# Patient Record
Sex: Male | Born: 1975 | Race: White | Hispanic: No | Marital: Single | Smoking: Current every day smoker
Health system: Southern US, Community
[De-identification: ages and names within clinical notes are randomized; demographics above are authoritative.]

## PROBLEM LIST (undated history)

## (undated) DIAGNOSIS — F1910 Other psychoactive substance abuse, uncomplicated: Secondary | ICD-10-CM

## (undated) DIAGNOSIS — J45909 Unspecified asthma, uncomplicated: Secondary | ICD-10-CM

## (undated) DIAGNOSIS — I10 Essential (primary) hypertension: Secondary | ICD-10-CM

---

## 2002-09-17 ENCOUNTER — Emergency Department (HOSPITAL_COMMUNITY): Admission: EM | Admit: 2002-09-17 | Discharge: 2002-09-17 | Payer: Self-pay | Admitting: Emergency Medicine

## 2003-04-23 ENCOUNTER — Emergency Department (HOSPITAL_COMMUNITY): Admission: EM | Admit: 2003-04-23 | Discharge: 2003-04-23 | Payer: Self-pay | Admitting: Emergency Medicine

## 2003-05-03 ENCOUNTER — Emergency Department (HOSPITAL_COMMUNITY): Admission: EM | Admit: 2003-05-03 | Discharge: 2003-05-03 | Payer: Self-pay | Admitting: Emergency Medicine

## 2003-05-07 ENCOUNTER — Emergency Department (HOSPITAL_COMMUNITY): Admission: EM | Admit: 2003-05-07 | Discharge: 2003-05-07 | Payer: Self-pay | Admitting: Emergency Medicine

## 2003-05-31 ENCOUNTER — Emergency Department (HOSPITAL_COMMUNITY): Admission: EM | Admit: 2003-05-31 | Discharge: 2003-05-31 | Payer: Self-pay | Admitting: Emergency Medicine

## 2003-07-02 ENCOUNTER — Emergency Department (HOSPITAL_COMMUNITY): Admission: EM | Admit: 2003-07-02 | Discharge: 2003-07-02 | Payer: Self-pay | Admitting: Emergency Medicine

## 2003-07-10 ENCOUNTER — Emergency Department (HOSPITAL_COMMUNITY): Admission: AD | Admit: 2003-07-10 | Discharge: 2003-07-10 | Payer: Self-pay | Admitting: Family Medicine

## 2003-07-12 ENCOUNTER — Emergency Department (HOSPITAL_COMMUNITY): Admission: AD | Admit: 2003-07-12 | Discharge: 2003-07-12 | Payer: Self-pay | Admitting: Family Medicine

## 2003-07-13 ENCOUNTER — Emergency Department (HOSPITAL_COMMUNITY): Admission: EM | Admit: 2003-07-13 | Discharge: 2003-07-13 | Payer: Self-pay | Admitting: Emergency Medicine

## 2003-10-19 ENCOUNTER — Emergency Department (HOSPITAL_COMMUNITY): Admission: EM | Admit: 2003-10-19 | Discharge: 2003-10-19 | Payer: Self-pay | Admitting: Family Medicine

## 2004-09-05 ENCOUNTER — Emergency Department (HOSPITAL_COMMUNITY): Admission: EM | Admit: 2004-09-05 | Discharge: 2004-09-05 | Payer: Self-pay | Admitting: Family Medicine

## 2004-10-08 ENCOUNTER — Emergency Department (HOSPITAL_COMMUNITY): Admission: EM | Admit: 2004-10-08 | Discharge: 2004-10-08 | Payer: Self-pay | Admitting: Family Medicine

## 2004-10-08 ENCOUNTER — Emergency Department: Payer: Self-pay | Admitting: Emergency Medicine

## 2004-12-01 ENCOUNTER — Emergency Department (HOSPITAL_COMMUNITY): Admission: EM | Admit: 2004-12-01 | Discharge: 2004-12-01 | Payer: Self-pay | Admitting: Family Medicine

## 2004-12-01 ENCOUNTER — Emergency Department (HOSPITAL_COMMUNITY): Admission: EM | Admit: 2004-12-01 | Discharge: 2004-12-01 | Payer: Self-pay | Admitting: Emergency Medicine

## 2005-01-31 ENCOUNTER — Emergency Department (HOSPITAL_COMMUNITY): Admission: EM | Admit: 2005-01-31 | Discharge: 2005-01-31 | Payer: Self-pay | Admitting: Emergency Medicine

## 2005-04-03 ENCOUNTER — Emergency Department (HOSPITAL_COMMUNITY): Admission: EM | Admit: 2005-04-03 | Discharge: 2005-04-03 | Payer: Self-pay | Admitting: Emergency Medicine

## 2005-06-08 ENCOUNTER — Emergency Department (HOSPITAL_COMMUNITY): Admission: EM | Admit: 2005-06-08 | Discharge: 2005-06-08 | Payer: Self-pay | Admitting: Emergency Medicine

## 2006-09-28 ENCOUNTER — Emergency Department (HOSPITAL_COMMUNITY): Admission: EM | Admit: 2006-09-28 | Discharge: 2006-09-28 | Payer: Self-pay | Admitting: Emergency Medicine
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
## 2007-01-20 ENCOUNTER — Emergency Department (HOSPITAL_COMMUNITY): Admission: EM | Admit: 2007-01-20 | Discharge: 2007-01-20 | Payer: Self-pay | Admitting: Emergency Medicine

## 2012-06-05 ENCOUNTER — Emergency Department (HOSPITAL_COMMUNITY)
Admission: EM | Admit: 2012-06-05 | Discharge: 2012-06-06 | Disposition: A | Discharge status: Home / Self Care | Payer: Self-pay | Attending: Emergency Medicine | Admitting: Emergency Medicine

## 2012-06-05 ENCOUNTER — Encounter (HOSPITAL_COMMUNITY): Payer: Self-pay | Admitting: Emergency Medicine

## 2012-06-05 DIAGNOSIS — F1120 Opioid dependence, uncomplicated: Secondary | ICD-10-CM | POA: Insufficient documentation

## 2012-06-05 DIAGNOSIS — F17200 Nicotine dependence, unspecified, uncomplicated: Secondary | ICD-10-CM | POA: Insufficient documentation

## 2012-06-05 DIAGNOSIS — R110 Nausea: Secondary | ICD-10-CM | POA: Insufficient documentation

## 2012-06-05 DIAGNOSIS — J45909 Unspecified asthma, uncomplicated: Secondary | ICD-10-CM | POA: Insufficient documentation

## 2012-06-05 DIAGNOSIS — Z79899 Other long term (current) drug therapy: Secondary | ICD-10-CM | POA: Insufficient documentation

## 2012-06-05 HISTORY — DX: Unspecified asthma, uncomplicated: J45.909

## 2012-06-05 HISTORY — DX: Other psychoactive substance abuse, uncomplicated: F19.10

## 2012-06-05 NOTE — ED Notes (Signed)
Pt c/o nausea, restlessness, lethargy. Pt states he tapered Methadone after 19yrs of usage.Last dose Monday 1mg . Pt would like guidance on detox, pt states he was not anticipating feeling the s/s he is having at this time. Pt appears weak in triage.

## 2012-06-06 MED ORDER — METHADONE HCL 10 MG PO TABS
10.0000 mg | ORAL_TABLET | Freq: Once | ORAL | Status: AC
  Administered 2012-06-06: 10 mg via ORAL
  Filled 2012-06-06: qty 1

## 2012-06-06 MED ORDER — CLONIDINE HCL 0.1 MG/24HR TD PTWK: MEDICATED_PATCH | TRANSDERMAL | Status: DC

## 2012-06-06 MED ORDER — ONDANSETRON 8 MG PO TBDP: ORAL_TABLET | ORAL | Status: DC

## 2012-06-06 MED ORDER — METOCLOPRAMIDE HCL 10 MG PO TABS: 10.0000 mg | ORAL_TABLET | Freq: Four times a day (QID) | ORAL | Status: DC | PRN

## 2012-06-06 NOTE — ED Provider Notes (Signed)
History     CSN: [id number]  Arrival date & time 06/05/12  2120   First MD Initiated Contact with Patient 06/05/12 2325      Chief Complaint  Patient presents with  . 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 There is no treatment prior to arrival. He wants to follow up with his methadone clinic and likely start back on methadone but clinics not open tonight. He wants 10mg  methadone in ED. Past Medical History  Diagnosis Date  . Substance abuse   . Asthma     History reviewed. No pertinent past surgical history.  No family history on file.  History  Substance Use Topics  . Smoking status: Current Every Day Smoker  . Smokeless tobacco: Not on file  . Alcohol Use: No      Review of Systems 10 Systems reviewed and are negative for acute change except as noted in the HPI. Allergies  Review of patient's allergies indicates no known allergies.  Home Medications   Current Outpatient Rx  Name  Route  Sig  Dispense  Refill  . ALBUTEROL SULFATE HFA 108 (90 BASE) MCG/ACT IN AERS   Inhalation   Inhale 2 puffs into the lungs every 6 (six) hours as needed. For shortness of breath         . ALPRAZOLAM 2 MG PO TABS   Oral   Take 2 mg by mouth 3 (three) times daily as needed. For anxiety         . AMPHETAMINE-DEXTROAMPHETAMINE 30 MG PO TABS   Oral   Take 30 mg by mouth 2 (two) times daily as needed. For A.D.D         . IBUPROFEN 200 MG PO TABS   Oral   Take 800 mg by mouth every 6  (six) hours as needed. For pain         . METHADONE HCL 5 MG/5ML PO SOLN   Oral   Take 1 mg by mouth daily.         Marland Kitchen CLONIDINE HCL 0.1 MG/24HR TD PTWK      Apply all 3 patches at once and leave them on for 48 hours, after the first 48 hours remove one patch, after the second 48 hours remove one patch, then leave the remaining single patch on for 2-3 days then remove.   3 patch   0   . METOCLOPRAMIDE HCL 10 MG PO TABS   Oral   Take 1 tablet (10 mg total) by mouth every 6 (six) hours as needed (nausea/headache).   6 tablet   0   . ONDANSETRON 8 MG PO TBDP      8mg  ODT q4 hours prn nausea   4 tablet   0     BP 120/82  Pulse 89  Temp 97.8 F (36.6 C) (Oral)  Resp 20  Ht 6' (1.829 m)  Wt 255 lb (115.667 kg)  BMI 34.58 kg/m2  SpO2 100%  Physical Exam  Nursing note and vitals reviewed. Constitutional:       Awake, alert, nontoxic appearance.  HENT:  Head: Atraumatic.  Eyes: Right eye exhibits no discharge. Left eye exhibits no discharge.  Neck: Neck supple.  Cardiovascular: Normal rate and regular rhythm.   No murmur heard. Pulmonary/Chest: Effort normal and breath sounds normal. No respiratory distress. He has no wheezes. He has no rales. He exhibits no tenderness.  Abdominal: Soft. There is no tenderness. There is no rebound.  Musculoskeletal: He exhibits no tenderness.       Baseline ROM, no obvious new focal weakness.  Neurological: He is alert.       Mental status and motor strength appears baseline for patient and situation.  Skin: No rash noted.  Psychiatric:       Anxious no SI/HI/hallucinations    ED Course  Procedures (including critical care time)  Labs Reviewed - No data to display No results found.   1. Methadone dependence       MDM   Pt stable in ED with no significant deterioration in condition.Patient / Family / Caregiver informed of clinical course, understand medical decision-making process, and agree with plan. I agreed to a  one-time dose of 10 mg methadone while the patient was in the emergency department. He understands the emergency department does not routinely dose of methadone or prescribe methadone.  I doubt any other EMC precluding discharge at this time including, but not necessarily limited to the following: Severe withdrawal.        Hurman Horn, MD 06/06/12 1432

## 2012-09-19 ENCOUNTER — Emergency Department (HOSPITAL_COMMUNITY)
Admission: EM | Admit: 2012-09-19 | Discharge: 2012-09-19 | Disposition: A | Discharge status: Home / Self Care | Payer: Self-pay | Attending: Emergency Medicine | Admitting: Emergency Medicine

## 2012-09-19 ENCOUNTER — Encounter (HOSPITAL_COMMUNITY): Payer: Self-pay

## 2012-09-19 DIAGNOSIS — J45909 Unspecified asthma, uncomplicated: Secondary | ICD-10-CM | POA: Insufficient documentation

## 2012-09-19 DIAGNOSIS — F1910 Other psychoactive substance abuse, uncomplicated: Secondary | ICD-10-CM | POA: Insufficient documentation

## 2012-09-19 DIAGNOSIS — R443 Hallucinations, unspecified: Secondary | ICD-10-CM | POA: Insufficient documentation

## 2012-09-19 DIAGNOSIS — R110 Nausea: Secondary | ICD-10-CM | POA: Insufficient documentation

## 2012-09-19 DIAGNOSIS — F17200 Nicotine dependence, unspecified, uncomplicated: Secondary | ICD-10-CM | POA: Insufficient documentation

## 2012-09-19 DIAGNOSIS — F1123 Opioid dependence with withdrawal: Secondary | ICD-10-CM

## 2012-09-19 DIAGNOSIS — F19939 Other psychoactive substance use, unspecified with withdrawal, unspecified: Secondary | ICD-10-CM | POA: Insufficient documentation

## 2012-09-19 DIAGNOSIS — Z79899 Other long term (current) drug therapy: Secondary | ICD-10-CM | POA: Insufficient documentation

## 2012-09-19 MED ORDER — PROMETHAZINE HCL 25 MG PO TABS: 25.0000 mg | ORAL_TABLET | Freq: Four times a day (QID) | ORAL | Status: DC | PRN

## 2012-09-19 MED ORDER — CLONIDINE HCL 0.2 MG PO TABS: 0.1000 mg | ORAL_TABLET | Freq: Three times a day (TID) | ORAL | Status: DC | PRN

## 2012-09-19 NOTE — ED Notes (Signed)
I have just had a lengthy conversation with pt. In the presence of our C.N., Patty, Dowd to wit: Pt. Was very unsatisfied with Dr. Patria Mane' demeanor and treatment; and was disgusted that Dr. Patria Mane did not and would not prescribe Methadone for him. It is Dr. Patria Mane' contention that pt. Is very nearly through his Methadone withdrawal symptoms, and the appropriate treatment for this at this time is clonidine and phenergan, with which pt. Very much disagrees.  He further refuses to accept prescriptions of discharge instructions, stating, "I'm gonna call Monday and tell them I am not going to pay for any of this visit; and Dr. Patria Mane was very unprofessional".  He did acknowlege the nursing staff were "nice".

## 2012-09-19 NOTE — ED Notes (Signed)
He states he has been financially unable to continue his Methadone clinic treatments as of the 20th of this month.  He had gotten down to a daily dose of 25 mg of methadone at the clinic since Dec. Of last year.  He c/o anxiety/diarrhea/occasional vomiting, chills and "jumpiness".  He is oriented x 4 and in no distress.  His skin is flushed, warm and dry.  He is concerned about the cost of any prescriptions written, as he has been unable to afford some of those he obtained here in the past.

## 2012-09-19 NOTE — ED Provider Notes (Signed)
History     CSN: [id number]  Arrival date & time 09/19/12  0911   First MD Initiated Contact with Patient 09/19/12 0913      Chief Complaint  Patient presents with  . Withdrawal     The history is provided by the patient and medical records.   Patient reports long-standing history of methadone use.  His last use of methadone was 5 days ago.  He no longer is able to afford his methadone the methadone clinic and is requesting assistance with withdrawal symptoms.  He would like for me to prescribe him methadone.  Patient has some nausea and decreased sleep.  He states he tried Ambien before but makes him hallucinate.  He states he can't afford the clonidine patch that's been prescribed before in the past.  Patient symptoms are mild to moderate.   Past Medical History  Diagnosis Date  . Substance abuse   . Asthma     History reviewed. No pertinent past surgical history.  History reviewed. No pertinent family history.  History  Substance Use Topics  . Smoking status: Current Every Day Smoker  . Smokeless tobacco: Not on file  . Alcohol Use: No      Review of Systems  All other systems reviewed and are negative.    Allergies  Review of patient's allergies indicates no known allergies.  Home Medications   Current Outpatient Rx  Name  Route  Sig  Dispense  Refill  . albuterol (PROVENTIL HFA;VENTOLIN HFA) 108 (90 BASE) MCG/ACT inhaler   Inhalation   Inhale 2 puffs into the lungs every 6 (six) hours as needed. For shortness of breath         . alprazolam (XANAX) 2 MG tablet   Oral   Take 2 mg by mouth 3 (three) times daily as needed. For anxiety         . amphetamine-dextroamphetamine (ADDERALL) 30 MG tablet   Oral   Take 30 mg by mouth 2 (two) times daily as needed. For A.D.D         . cloNIDine (CATAPRES - DOSED IN MG/24 HR) 0.1 mg/24hr patch      Apply all 3 patches at once and leave them on for 48 hours, after the first 48 hours remove one patch, after  the second 48 hours remove one patch, then leave the remaining single patch on for 2-3 days then remove.   3 patch   0   . ibuprofen (ADVIL,MOTRIN) 200 MG tablet   Oral   Take 800 mg by mouth every 6 (six) hours as needed. For pain         . methadone (DOLOPHINE) 5 MG/5ML solution   Oral   Take 1 mg by mouth daily.         . metoCLOPramide (REGLAN) 10 MG tablet   Oral   Take 1 tablet (10 mg total) by mouth every 6 (six) hours as needed (nausea/headache).   6 tablet   0   . ondansetron (ZOFRAN ODT) 8 MG disintegrating tablet      8mg  ODT q4 hours prn nausea   4 tablet   0     BP 142/93  Pulse 112  Temp(Src) 97.8 F (36.6 C) (Oral)  SpO2 100%  Physical Exam  Nursing note and vitals reviewed. Constitutional: He is oriented to person, place, and time. He appears well-developed and well-nourished.  HENT:  Head: Normocephalic and atraumatic.  Eyes: EOM are normal.  Neck: Normal range of motion.  Cardiovascular:  Tachycardic  Pulmonary/Chest: Effort normal.  Abdominal: He exhibits no distension.  Musculoskeletal: Normal range of motion.  Neurological: He is alert and oriented to person, place, and time.  Psychiatric: He has a normal mood and affect. Judgment normal.    ED Course  Procedures (including critical care time)  Labs Reviewed - No data to display No results found.   1. Opioid withdrawal       MDM  The patient presents today with methadone dependence.  There is no indication for involuntary commitment for inpatient treatment.  I think the patient is best managed as an outpatient for his/her dependence.  The patient will be discharged home with a prescription for clonidine and antiemitics.          Lyanne Co, MD 09/19/12 [phone]

## 2012-10-27 ENCOUNTER — Encounter (HOSPITAL_COMMUNITY): Payer: Self-pay | Admitting: Emergency Medicine

## 2012-10-27 ENCOUNTER — Emergency Department (INDEPENDENT_AMBULATORY_CARE_PROVIDER_SITE_OTHER)
Admission: EM | Admit: 2012-10-27 | Discharge: 2012-10-27 | Disposition: A | Discharge status: Home / Self Care | Payer: Self-pay | Source: Home / Self Care | Attending: Family Medicine | Admitting: Family Medicine

## 2012-10-27 DIAGNOSIS — F411 Generalized anxiety disorder: Secondary | ICD-10-CM

## 2012-10-27 DIAGNOSIS — F419 Anxiety disorder, unspecified: Secondary | ICD-10-CM

## 2012-10-27 MED ORDER — CLONAZEPAM 1 MG PO TABS: 1.0000 mg | ORAL_TABLET | Freq: Two times a day (BID) | ORAL | Status: DC | PRN

## 2012-10-27 NOTE — ED Notes (Signed)
Patient needing a medication refill-klonopin-dr millsapps was the last physician to write for klonopin.  Mark Frye requiring a visit-but patient does not have the funds for a visit.  Patient also reports "coming off" of methadone this month after a six month history of trying to reduce medication

## 2012-10-27 NOTE — ED Provider Notes (Signed)
History     CSN: [id number]  Arrival date & time 10/27/12  1439   First MD Initiated Contact with Patient 10/27/12 1443      Chief Complaint  Patient presents with  . Medication Refill    (Consider location/radiation/quality/duration/timing/severity/associated sxs/prior treatment) Patient is a 37 y.o. male presenting with mental health disorder. The history is provided by the patient.  Mental Health Problem Degree of incapacity (severity):  Mild Progression:  Unchanged Context: drug abuse   Context comment:  Off of methadone care.has primary md to further care for him. Associated symptoms: anxiety     Past Medical History  Diagnosis Date  . Substance abuse   . Asthma     History reviewed. No pertinent past surgical history.  No family history on file.  History  Substance Use Topics  . Smoking status: Current Every Day Smoker  . Smokeless tobacco: Not on file  . Alcohol Use: No      Review of Systems  Constitutional: Negative.   Psychiatric/Behavioral: The patient is nervous/anxious.     Allergies  Review of patient's allergies indicates no known allergies.  Home Medications   Current Outpatient Rx  Name  Route  Sig  Dispense  Refill  . clonazePAM (KLONOPIN) 1 MG tablet   Oral   Take 1 mg by mouth 2 (two) times daily as needed for anxiety.         . methadone (DOLOPHINE) 5 MG/5ML solution   Oral   Take 1 mg by mouth daily.         Marland Kitchen albuterol (PROVENTIL HFA;VENTOLIN HFA) 108 (90 BASE) MCG/ACT inhaler   Inhalation   Inhale 2 puffs into the lungs every 6 (six) hours as needed. For shortness of breath         . alprazolam (XANAX) 2 MG tablet   Oral   Take 2 mg by mouth 3 (three) times daily as needed. For anxiety         . amphetamine-dextroamphetamine (ADDERALL) 30 MG tablet   Oral   Take 30 mg by mouth 2 (two) times daily as needed. For A.D.D         . clonazePAM (KLONOPIN) 1 MG tablet   Oral   Take 1 tablet (1 mg total) by mouth 2  (two) times daily as needed for anxiety.   14 tablet   0   . cloNIDine (CATAPRES - DOSED IN MG/24 HR) 0.1 mg/24hr patch      Apply all 3 patches at once and leave them on for 48 hours, after the first 48 hours remove one patch, after the second 48 hours remove one patch, then leave the remaining single patch on for 2-3 days then remove.   3 patch   0   . cloNIDine (CATAPRES) 0.2 MG tablet   Oral   Take 0.5 tablets (0.1 mg total) by mouth every 8 (eight) hours as needed (withdrawl symptoms).   15 tablet   0   . ibuprofen (ADVIL,MOTRIN) 200 MG tablet   Oral   Take 800 mg by mouth every 6 (six) hours as needed. For pain         . metoCLOPramide (REGLAN) 10 MG tablet   Oral   Take 1 tablet (10 mg total) by mouth every 6 (six) hours as needed (nausea/headache).   6 tablet   0   . ondansetron (ZOFRAN ODT) 8 MG disintegrating tablet      8mg  ODT q4 hours prn nausea   4  tablet   0   . promethazine (PHENERGAN) 25 MG tablet   Oral   Take 1 tablet (25 mg total) by mouth every 6 (six) hours as needed for nausea.   15 tablet   0     BP 155/86  Pulse 121  Temp(Src) 98.1 F (36.7 C) (Oral)  Resp 20  SpO2 99%  Physical Exam  Nursing note and vitals reviewed. Constitutional: He is oriented to person, place, and time. He appears well-developed and well-nourished.  Neurological: He is alert and oriented to person, place, and time.  Skin: Skin is warm and dry.  Psychiatric: His behavior is normal. Judgment and thought content normal.    ED Course  Procedures (including critical care time)  Labs Reviewed - No data to display No results found.   1. Anxiety disorder       MDM          Linna Hoff, MD 10/27/12 1515

## 2012-10-27 NOTE — ED Notes (Signed)
Provided ice pack

## 2012-11-01 ENCOUNTER — Emergency Department (HOSPITAL_COMMUNITY)
Admission: EM | Admit: 2012-11-01 | Discharge: 2012-11-01 | Discharge status: Against Medical Advice | Payer: Self-pay | Attending: Emergency Medicine | Admitting: Emergency Medicine

## 2012-11-01 ENCOUNTER — Encounter (HOSPITAL_COMMUNITY): Payer: Self-pay | Admitting: Emergency Medicine

## 2012-11-01 ENCOUNTER — Emergency Department (HOSPITAL_COMMUNITY)
Admission: EM | Admit: 2012-11-01 | Discharge: 2012-11-01 | Disposition: A | Discharge status: Home / Self Care | Payer: Self-pay | Attending: Emergency Medicine | Admitting: Emergency Medicine

## 2012-11-01 ENCOUNTER — Encounter (HOSPITAL_COMMUNITY): Payer: Self-pay

## 2012-11-01 DIAGNOSIS — F17200 Nicotine dependence, unspecified, uncomplicated: Secondary | ICD-10-CM | POA: Insufficient documentation

## 2012-11-01 DIAGNOSIS — F1110 Opioid abuse, uncomplicated: Secondary | ICD-10-CM | POA: Insufficient documentation

## 2012-11-01 DIAGNOSIS — F19939 Other psychoactive substance use, unspecified with withdrawal, unspecified: Secondary | ICD-10-CM | POA: Insufficient documentation

## 2012-11-01 DIAGNOSIS — J45909 Unspecified asthma, uncomplicated: Secondary | ICD-10-CM | POA: Insufficient documentation

## 2012-11-01 DIAGNOSIS — Z79899 Other long term (current) drug therapy: Secondary | ICD-10-CM | POA: Insufficient documentation

## 2012-11-01 DIAGNOSIS — F1920 Other psychoactive substance dependence, uncomplicated: Secondary | ICD-10-CM | POA: Insufficient documentation

## 2012-11-01 DIAGNOSIS — F1123 Opioid dependence with withdrawal: Secondary | ICD-10-CM

## 2012-11-01 DIAGNOSIS — F1120 Opioid dependence, uncomplicated: Secondary | ICD-10-CM

## 2012-11-01 DIAGNOSIS — F411 Generalized anxiety disorder: Secondary | ICD-10-CM | POA: Insufficient documentation

## 2012-11-01 DIAGNOSIS — F1910 Other psychoactive substance abuse, uncomplicated: Secondary | ICD-10-CM | POA: Insufficient documentation

## 2012-11-01 DIAGNOSIS — G479 Sleep disorder, unspecified: Secondary | ICD-10-CM | POA: Insufficient documentation

## 2012-11-01 DIAGNOSIS — R110 Nausea: Secondary | ICD-10-CM | POA: Insufficient documentation

## 2012-11-01 MED ORDER — PERCOCET 5-325 MG PO TABS: 1.0000 | ORAL_TABLET | Freq: Four times a day (QID) | ORAL | Status: DC | PRN

## 2012-11-01 NOTE — ED Provider Notes (Signed)
Medical screening examination/treatment/procedure(s) were performed by non-physician practitioner and as supervising physician I was immediately available for consultation/collaboration.   Glynn Octave, MD 11/01/12 1517

## 2012-11-01 NOTE — ED Notes (Signed)
Patient feeling anxiety as he states he  Needs help with detoxing. Was receiving help from [facility]-last day of his treatment was this last Wednesday. Has been taking klonopin 1mg  twice daily but has been taking it three times daily. States he needs help because he does not want to go back to using heroin.

## 2012-11-01 NOTE — ED Provider Notes (Signed)
History     CSN: [id number]  Arrival date & time 11/01/12  0846   First MD Initiated Contact with Patient 11/01/12 [phone]      Chief Complaint  Patient presents with  . Anxiety    (Consider location/radiation/quality/duration/timing/severity/associated sxs/prior treatment) HPI Patient reports he started using narcotics and then heroin while he was in college. He relates he started on methadone in 2006 and has been in and out of various programs since then. He reports he has been going to [facility] however it costs $400 a month which he's been having trouble getting the money for. He states he was discharged from there one month ago and it was reinstated however he was discharged from their practice again on Wednesday, June 4. He states he was not offered "humane detox", and he was referred to  [facility], however he states they cannot see him for another 3 days. Patient is here requesting methadone. Patient was seen in the ED on April 26 with similar complaint. At that time he was very upset when he was told he could not be prescribed methadone. He was seen in urgent care on June 3 and was prescribed Klonopin which he states he still has. He also states he cannot afford the clonidine patches. Patient states he was seen before in the ED by Dr. Wayland Salinas and was given methadone in the ED.  PCP none  Past Medical History  Diagnosis Date  . Substance abuse   . Asthma     History reviewed. No pertinent past surgical history.  No family history on file.  History  Substance Use Topics  . Smoking status: Current Every Day Smoker -- 1.00 packs/day  . Smokeless tobacco: Never Used  . Alcohol Use: No      Review of Systems  All other systems reviewed and are negative.    Allergies  Review of patient's allergies indicates no known allergies.  Home Medications   Current Outpatient Rx  Name  Route  Sig  Dispense  Refill  . albuterol (PROVENTIL HFA;VENTOLIN HFA) 108 (90  BASE) MCG/ACT inhaler   Inhalation   Inhale 2 puffs into the lungs every 6 (six) hours as needed for shortness of breath.          . amphetamine-dextroamphetamine (ADDERALL) 30 MG tablet   Oral   Take 30 mg by mouth 2 (two) times daily as needed. For A.D.D         . clonazePAM (KLONOPIN) 1 MG tablet   Oral   Take 1 mg by mouth 2 (two) times daily as needed for anxiety.         Marland Kitchen ibuprofen (ADVIL,MOTRIN) 200 MG tablet   Oral   Take 800 mg by mouth every 8 (eight) hours as needed for pain.          . Multiple Vitamin (MULTIVITAMIN WITH MINERALS) TABS   Oral   Take 1 tablet by mouth daily.         . cloNIDine (CATAPRES - DOSED IN MG/24 HR) 0.1 mg/24hr patch      Apply all 3 patches at once and leave them on for 48 hours, after the first 48 hours remove one patch, after the second 48 hours remove one patch, then leave the remaining single patch on for 2-3 days then remove.   3 patch   0   . cloNIDine (CATAPRES) 0.2 MG tablet   Oral   Take 0.5 tablets (0.1 mg total) by mouth every  8 (eight) hours as needed (withdrawl symptoms).   15 tablet   0   . ondansetron (ZOFRAN-ODT) 8 MG disintegrating tablet   Oral   Take 8 mg by mouth every 4 (four) hours as needed for nausea.         . promethazine (PHENERGAN) 25 MG tablet   Oral   Take 1 tablet (25 mg total) by mouth every 6 (six) hours as needed for nausea.   15 tablet   0     BP 144/73  Temp(Src) 98 F (36.7 C) (Oral)  Resp 24  SpO2 98%  Vital signs normal    Physical Exam  Nursing note and vitals reviewed. Constitutional: He is oriented to person, place, and time. He appears well-developed and well-nourished.  Non-toxic appearance. He does not appear ill. No distress.  HENT:  Head: Normocephalic and atraumatic.  Right Ear: External ear normal.  Left Ear: External ear normal.  Nose: Nose normal. No mucosal edema or rhinorrhea.  Mouth/Throat: Oropharynx is clear and moist and mucous membranes are normal.  No dental abscesses or edematous.  Eyes: Conjunctivae and EOM are normal. Pupils are equal, round, and reactive to light.  Neck: Normal range of motion and full passive range of motion without pain. Neck supple.  Cardiovascular: Normal rate, regular rhythm and normal heart sounds.  Exam reveals no gallop and no friction rub.   No murmur heard. Pulmonary/Chest: Effort normal and breath sounds normal. No respiratory distress. He has no rhonchi. He exhibits no crepitus.  Abdominal: Normal appearance.  Musculoskeletal: Normal range of motion. He exhibits no edema and no tenderness.  Moves all extremities well. ambulates without difficulty.   Neurological: He is alert and oriented to person, place, and time. He has normal strength. No cranial nerve deficit.  Skin: Skin is warm, dry and intact. No rash noted. No erythema. No pallor.  No diaphoresis  Psychiatric: He has a normal mood and affect. His speech is normal and behavior is normal. His mood appears not anxious.    ED Course  Procedures (including critical care time)  I explained to the patient I am not licensed to prescribe methadone for chronic pain or heroin abuse. And also advised him most emergency physicians are not. At that time he pointed out to me that Dr. Fonnie Jarvis had given him methadone in the ED. I looked at his record and he was seen in January of this year and had been tapered off his methadone by his methadone clinic over several months and had been on 1 mg of methadone and he was prescribed 10 mg of methadone for one dose in the ED.  I offered to give him a prescription for clonidine pills which are very inexpensive and nausea medication. He told me he still had Klonopin from his urgent care visit on the third. He also wanted opiate pain pills but I explained I couldn't write them for this problem. However patient was upset and stood up and left the room.  Pt left during exam, wanting to speak to registration and "the department  of complaints".    1. Chronic narcotic dependence     Pt left AMA   Devoria Albe, MD, FACEP   MDM          Ward Givens, MD 11/01/12 [phone]

## 2012-11-01 NOTE — ED Notes (Signed)
Patient takes methadone for treatment used to snort heroin and use morphine.  Scheduled for treatment in 3 days however stated going through withdraw now. Denies SI and HI.

## 2012-11-01 NOTE — ED Provider Notes (Signed)
History     CSN: [id number]  Arrival date & time 11/01/12  4098   First MD Initiated Contact with Patient 11/01/12 [phone]      Chief Complaint  Patient presents with  . Withdrawal    (Consider location/radiation/quality/duration/timing/severity/associated sxs/prior treatment) HPI Patient presents emergency Department with withdrawal symptoms from methadone.  Patient, states, that he has a new clinic, which he will begin a program on Wednesday.  Patient is here requesting low-dose Percocet told Tuesday patient, states he's had nausea, anxiety and trouble sleeping, since he's been off methadone over the last 4 days.  Patient, states it is not had any chest pain, shortness of breath, headache, blurred vision, dizziness, hallucinations or syncope.  Patient, states, that nothing seems to make his condition, better or worse Past Medical History  Diagnosis Date  . Substance abuse   . Asthma     History reviewed. No pertinent past surgical history.  No family history on file.  History  Substance Use Topics  . Smoking status: Current Every Day Smoker -- 1.00 packs/day  . Smokeless tobacco: Never Used  . Alcohol Use: No      Review of Systems All other systems negative except as documented in the HPI. All pertinent positives and negatives as reviewed in the HPI. Allergies  Review of patient's allergies indicates no known allergies.  Home Medications   Current Outpatient Rx  Name  Route  Sig  Dispense  Refill  . albuterol (PROVENTIL HFA;VENTOLIN HFA) 108 (90 BASE) MCG/ACT inhaler   Inhalation   Inhale 2 puffs into the lungs every 6 (six) hours as needed for shortness of breath.          . amphetamine-dextroamphetamine (ADDERALL) 30 MG tablet   Oral   Take 30 mg by mouth 2 (two) times daily as needed. For A.D.D         . clonazePAM (KLONOPIN) 1 MG tablet   Oral   Take 1 mg by mouth 2 (two) times daily as needed for anxiety.         . cloNIDine (CATAPRES - DOSED IN  MG/24 HR) 0.1 mg/24hr patch      Apply all 3 patches at once and leave them on for 48 hours, after the first 48 hours remove one patch, after the second 48 hours remove one patch, then leave the remaining single patch on for 2-3 days then remove.   3 patch   0   . cloNIDine (CATAPRES) 0.2 MG tablet   Oral   Take 0.5 tablets (0.1 mg total) by mouth every 8 (eight) hours as needed (withdrawl symptoms).   15 tablet   0   . ibuprofen (ADVIL,MOTRIN) 200 MG tablet   Oral   Take 800 mg by mouth every 8 (eight) hours as needed for pain.          . Multiple Vitamin (MULTIVITAMIN WITH MINERALS) TABS   Oral   Take 1 tablet by mouth daily.         . ondansetron (ZOFRAN-ODT) 8 MG disintegrating tablet   Oral   Take 8 mg by mouth every 4 (four) hours as needed for nausea.         . promethazine (PHENERGAN) 25 MG tablet   Oral   Take 1 tablet (25 mg total) by mouth every 6 (six) hours as needed for nausea.   15 tablet   0     BP 148/80  Pulse 90  Temp(Src) 98.6 F (37 C) (Oral)  Resp 16  SpO2 100%  Physical Exam  Nursing note and vitals reviewed. Constitutional: He is oriented to person, place, and time. He appears well-developed and well-nourished. No distress.  HENT:  Head: Normocephalic and atraumatic.  Mouth/Throat: Oropharynx is clear and moist.  Eyes: Pupils are equal, round, and reactive to light.  Neck: Normal range of motion. Neck supple.  Cardiovascular: Normal rate, regular rhythm and normal heart sounds.  Exam reveals no gallop and no friction rub.   No murmur heard. Pulmonary/Chest: Effort normal and breath sounds normal.  Neurological: He is alert and oriented to person, place, and time.  Skin: Skin is warm and dry. No rash noted. No erythema.  Psychiatric: He has a normal mood and affect. His behavior is normal. Judgment and thought content normal.    ED Course  Procedures (including critical care time)  Patient is advised, that we can give a very  limited quantity, and he is also advised the emergency department is not the resources for this type of issue.  He voices an understanding that he will not use this as his future resource.  I advised the patient that he would like to stop using methadone and he states the medications that he will need to make a plan with the clinic, which we will be attending the patient, states, that he would like to become clean, totally off of any drug but does not want inpatient therapy at this time.  Patient is not homicidal or suicidal.  I advised the patient that he can return here for inpatient detox as needed.   MDM          Carlyle Dolly, PA-C 11/01/12 1141

## 2012-11-01 NOTE — ED Notes (Signed)
MD Lynelle Doctor in to talk with patient with nurse as witness to conversation. Patient told that by MD that she could not write a prescription for methadone for chronic pain b/c out of her scope. However,she would write a prescription for nausea and clonidine. Patient stated that clonidine was too expensive. Patient became upset and stated he "was wasting his f'ing time here". Patient got up an walked out and asked for a patient satisfaction survey.

## 2013-08-23 ENCOUNTER — Emergency Department (HOSPITAL_COMMUNITY)
Admission: EM | Admit: 2013-08-23 | Discharge: 2013-08-23 | Disposition: A | Discharge status: Home / Self Care | Payer: Self-pay | Attending: Emergency Medicine | Admitting: Emergency Medicine

## 2013-08-23 ENCOUNTER — Encounter (HOSPITAL_COMMUNITY): Payer: Self-pay | Admitting: Emergency Medicine

## 2013-08-23 DIAGNOSIS — IMO0002 Reserved for concepts with insufficient information to code with codable children: Secondary | ICD-10-CM | POA: Insufficient documentation

## 2013-08-23 DIAGNOSIS — Z79899 Other long term (current) drug therapy: Secondary | ICD-10-CM | POA: Insufficient documentation

## 2013-08-23 DIAGNOSIS — R197 Diarrhea, unspecified: Secondary | ICD-10-CM | POA: Insufficient documentation

## 2013-08-23 DIAGNOSIS — R112 Nausea with vomiting, unspecified: Secondary | ICD-10-CM | POA: Insufficient documentation

## 2013-08-23 DIAGNOSIS — R6883 Chills (without fever): Secondary | ICD-10-CM | POA: Insufficient documentation

## 2013-08-23 DIAGNOSIS — F1320 Sedative, hypnotic or anxiolytic dependence, uncomplicated: Secondary | ICD-10-CM

## 2013-08-23 DIAGNOSIS — F17200 Nicotine dependence, unspecified, uncomplicated: Secondary | ICD-10-CM | POA: Insufficient documentation

## 2013-08-23 DIAGNOSIS — F411 Generalized anxiety disorder: Secondary | ICD-10-CM | POA: Insufficient documentation

## 2013-08-23 DIAGNOSIS — IMO0001 Reserved for inherently not codable concepts without codable children: Secondary | ICD-10-CM | POA: Insufficient documentation

## 2013-08-23 DIAGNOSIS — R259 Unspecified abnormal involuntary movements: Secondary | ICD-10-CM | POA: Insufficient documentation

## 2013-08-23 DIAGNOSIS — R000 Tachycardia, unspecified: Secondary | ICD-10-CM | POA: Insufficient documentation

## 2013-08-23 DIAGNOSIS — F1520 Other stimulant dependence, uncomplicated: Secondary | ICD-10-CM | POA: Insufficient documentation

## 2013-08-23 DIAGNOSIS — F1120 Opioid dependence, uncomplicated: Secondary | ICD-10-CM | POA: Insufficient documentation

## 2013-08-23 DIAGNOSIS — J45909 Unspecified asthma, uncomplicated: Secondary | ICD-10-CM | POA: Insufficient documentation

## 2013-08-23 DIAGNOSIS — R61 Generalized hyperhidrosis: Secondary | ICD-10-CM | POA: Insufficient documentation

## 2013-08-23 LAB — URINALYSIS, ROUTINE W REFLEX MICROSCOPIC
Bilirubin Urine: NEGATIVE
Glucose, UA: NEGATIVE mg/dL
HGB URINE DIPSTICK: NEGATIVE
KETONES UR: NEGATIVE mg/dL
Leukocytes, UA: NEGATIVE
Nitrite: NEGATIVE
PH: 5.5 (ref 5.0–8.0)
Protein, ur: NEGATIVE mg/dL
SPECIFIC GRAVITY, URINE: 1.021 (ref 1.005–1.030)
Urobilinogen, UA: 0.2 mg/dL (ref 0.0–1.0)

## 2013-08-23 LAB — RAPID URINE DRUG SCREEN, HOSP PERFORMED
AMPHETAMINES: NOT DETECTED
BARBITURATES: NOT DETECTED
Benzodiazepines: NOT DETECTED
COCAINE: NOT DETECTED
Opiates: NOT DETECTED
TETRAHYDROCANNABINOL: NOT DETECTED

## 2013-08-23 MED ORDER — ALUM & MAG HYDROXIDE-SIMETH 200-200-20 MG/5ML PO SUSP: 30.0000 mL | ORAL | Status: DC | PRN

## 2013-08-23 MED ORDER — ACETAMINOPHEN 325 MG PO TABS: 650.0000 mg | ORAL_TABLET | ORAL | Status: DC | PRN

## 2013-08-23 MED ORDER — CLONIDINE HCL 0.1 MG PO TABS: 0.1000 mg | ORAL_TABLET | Freq: Two times a day (BID) | ORAL | Status: DC | PRN

## 2013-08-23 MED ORDER — ONDANSETRON HCL 4 MG PO TABS: 4.0000 mg | ORAL_TABLET | Freq: Three times a day (TID) | ORAL | Status: DC | PRN

## 2013-08-23 MED ORDER — NICOTINE 21 MG/24HR TD PT24: 21.0000 mg | MEDICATED_PATCH | Freq: Every day | TRANSDERMAL | Status: DC

## 2013-08-23 MED ORDER — IBUPROFEN 200 MG PO TABS: 600.0000 mg | ORAL_TABLET | Freq: Three times a day (TID) | ORAL | Status: DC | PRN

## 2013-08-23 MED ORDER — CLONAZEPAM 1 MG PO TABS: 1.0000 mg | ORAL_TABLET | Freq: Three times a day (TID) | ORAL | Status: DC | PRN

## 2013-08-23 MED ORDER — CLONIDINE HCL 0.1 MG PO TABS
0.1000 mg | ORAL_TABLET | Freq: Once | ORAL | Status: AC
  Administered 2013-08-23: 0.1 mg via ORAL
  Filled 2013-08-23: qty 1

## 2013-08-23 MED ORDER — CLONAZEPAM 0.5 MG PO TABS
0.5000 mg | ORAL_TABLET | Freq: Once | ORAL | Status: AC
  Administered 2013-08-23: 0.5 mg via ORAL
  Filled 2013-08-23: qty 1

## 2013-08-23 NOTE — ED Provider Notes (Signed)
CSN: [id number]     Arrival date & time 08/23/13  0625 History   First MD Initiated Contact with Patient 08/23/13 0700     Chief Complaint  Patient presents with  . Medical Clearance     (Consider location/radiation/quality/duration/timing/severity/associated sxs/prior Treatment) HPI Comments: Pt requesting detox from methadone & clonazepam. He has been using methadone for about 9 years, has been getting it from friends for about 6 months. He reports having w/draw symptoms of n/v, d/a, diaphoresis, inc anxiety, tremor. Denies seizures. Last use of methadone 3 days ago, last use of clonazepam 4 days ago.   Patient is a 38 y.o. male presenting with drug problem. The history is provided by the patient. No language interpreter was used.  Drug Problem This is a chronic problem. The current episode started more than 1 week ago (9 years, last use of methadone 3 days ago, last use of clonazepam 4 days ago). The problem occurs constantly. The problem has not changed since onset.Pertinent negatives include no chest pain, no abdominal pain, no headaches and no shortness of breath. Nothing aggravates the symptoms. Nothing relieves the symptoms. Treatments tried: several rehab centers. The treatment provided no relief.    Past Medical History  Diagnosis Date  . Substance abuse   . Asthma    History reviewed. No pertinent past surgical history. History reviewed. No pertinent family history. History  Substance Use Topics  . Smoking status: Current Every Day Smoker -- 0.50 packs/day    Types: Cigarettes  . Smokeless tobacco: Never Used  . Alcohol Use: No    Review of Systems  Constitutional: Positive for chills and diaphoresis. Negative for fever, activity change, appetite change and fatigue.  HENT: Negative for congestion, facial swelling, rhinorrhea and trouble swallowing.   Eyes: Negative for photophobia and pain.  Respiratory: Negative for cough, chest tightness and shortness of breath.    Cardiovascular: Negative for chest pain and leg swelling.  Gastrointestinal: Positive for nausea, vomiting and diarrhea. Negative for abdominal pain and constipation.  Endocrine: Negative for polydipsia and polyuria.  Genitourinary: Negative for dysuria, urgency, decreased urine volume and difficulty urinating.  Musculoskeletal: Positive for myalgias. Negative for back pain and gait problem.  Skin: Negative for color change, rash and wound.  Allergic/Immunologic: Negative for immunocompromised state.  Neurological: Negative for dizziness, facial asymmetry, speech difficulty, weakness, numbness and headaches.  Psychiatric/Behavioral: Positive for agitation. Negative for confusion and decreased concentration.      Allergies  Review of patient's allergies indicates no known allergies.  Home Medications   Current Outpatient Rx  Name  Route  Sig  Dispense  Refill  . albuterol (PROVENTIL HFA;VENTOLIN HFA) 108 (90 BASE) MCG/ACT inhaler   Inhalation   Inhale 2 puffs into the lungs every 6 (six) hours as needed for shortness of breath.          . amphetamine-dextroamphetamine (ADDERALL) 30 MG tablet   Oral   Take 30 mg by mouth 2 (two) times daily as needed. For A.D.D         . clonazePAM (KLONOPIN) 1 MG tablet   Oral   Take 1 mg by mouth 2 (two) times daily as needed for anxiety.         . cloNIDine (CATAPRES - DOSED IN MG/24 HR) 0.1 mg/24hr patch      Apply all 3 patches at once and leave them on for 48 hours, after the first 48 hours remove one patch, after the second 48 hours remove one patch, then leave  the remaining single patch on for 2-3 days then remove.   3 patch   0   . cloNIDine (CATAPRES) 0.2 MG tablet   Oral   Take 0.5 tablets (0.1 mg total) by mouth every 8 (eight) hours as needed (withdrawl symptoms).   15 tablet   0   . ibuprofen (ADVIL,MOTRIN) 200 MG tablet   Oral   Take 800 mg by mouth every 8 (eight) hours as needed for pain.          . Multiple  Vitamin (MULTIVITAMIN WITH MINERALS) TABS   Oral   Take 1 tablet by mouth daily.         . ondansetron (ZOFRAN-ODT) 8 MG disintegrating tablet   Oral   Take 8 mg by mouth every 4 (four) hours as needed for nausea.         Marland Kitchen PERCOCET 5-325 MG per tablet   Oral   Take 1 tablet by mouth every 6 (six) hours as needed for pain.   15 tablet   0     Dispense as written.   . promethazine (PHENERGAN) 25 MG tablet   Oral   Take 1 tablet (25 mg total) by mouth every 6 (six) hours as needed for nausea.   15 tablet   0    BP 166/99  Pulse 121  Temp(Src) 97.8 F (36.6 C) (Oral)  Resp 16  Ht 6' (1.829 m)  Wt 250 lb (113.399 kg)  BMI 33.90 kg/m2  SpO2 98% Physical Exam  Constitutional: He is oriented to person, place, and time. He appears well-developed and well-nourished. No distress.  HENT:  Head: Normocephalic and atraumatic.  Mouth/Throat: No oropharyngeal exudate.  Eyes: Pupils are equal, round, and reactive to light.  Neck: Normal range of motion. Neck supple.  Cardiovascular: Regular rhythm and normal heart sounds.  Tachycardia present.  Exam reveals no gallop and no friction rub.   No murmur heard. Pulmonary/Chest: Effort normal and breath sounds normal. No respiratory distress. He has no wheezes. He has no rales.  Abdominal: Soft. Bowel sounds are normal. He exhibits no distension and no mass. There is no tenderness. There is no rebound and no guarding.  Musculoskeletal: Normal range of motion. He exhibits no edema and no tenderness.  Neurological: He is alert and oriented to person, place, and time.  Skin: Skin is warm. He is diaphoretic.  Psychiatric: He has a normal mood and affect.    ED Course  Procedures (including critical care time) Labs Review Labs Reviewed  CBC WITH DIFFERENTIAL  BASIC METABOLIC PANEL  URINALYSIS, ROUTINE W REFLEX MICROSCOPIC  ACETAMINOPHEN LEVEL  ETHANOL  URINE RAPID DRUG SCREEN (HOSP PERFORMED)  SALICYLATE LEVEL   Imaging  Review No results found.   EKG Interpretation None      MDM   Final diagnoses:  Methadone dependence  Benzodiazepine dependence    Pt is a 38 y.o. male with Pmhx as above who presents with request for detox from methadone & Klonopin which he has been using for about 9 years. He had been attending a methadone clinic, but stopped in Aug '14 and since has been obtaining it illegally. Last use 3 days ago. Rx for clonazepam being filled by [facility]. Last use 4 days ago. Denies SI/HI, AVH. He has had intermittent diaphoresis, n/v, tremor, myalgias. He states he called an emergency hotline this morning & was told to come to the ED. He has already been refused by [facility] and is interested in inpt  tx.  On PE, pt is clammy, tachycardic, but in NAD.  Cardiopulm exam benign. Will have TTS speak w/ pt, but am not sure if he will meet inpt admission criteria. Have ordered clonadine tab and clonazepam as he has been on the latter chronically.    8:48 AM Pt refusing lab draw, now saying out process takes too long and he would like to leave. i do not believe he is a danger to himself & others and can safely be discharged. Will ask him to f/u with his primary office for clonazepam rx.     Shanna CiscoMegan E Sena Hoopingarner, MD 08/23/13 [id number]

## 2013-08-23 NOTE — Discharge Instructions (Signed)
Finding Treatment for Alcohol and Drug Addiction °It can be hard to find the right place to get professional treatment. Here are some important things to consider: °· There are different types of treatment to choose from. °· Some programs are live-in (residential) while others are not (outpatient). Sometimes a combination is offered. °· No single type of program is right for everyone. °· Most treatment programs involve a combination of education, counseling, and a 12-step, spiritually-based approach. °· There are non-spiritually based programs (not 12-step). °· Some treatment programs are government sponsored. They are geared for patients without private insurance. °· Treatment programs can vary in many respects such as: °· Cost and types of insurance accepted. °· Types of on-site medical services offered. °· Length of stay, setting, and size. °· Overall philosophy of treatment.  °A person may need specialized treatment or have needs not addressed by all programs. For example, adolescents need treatment appropriate for their age. Other people have secondary disorders that must be managed as well. Secondary conditions can include mental illness, such as depression or diabetes. Often, a period of detoxification from alcohol or drugs is needed. This requires medical supervision and not all programs offer this. °THINGS TO CONSIDER WHEN SELECTING A TREATMENT PROGRAM  °· Is the program certified by the appropriate government agency? Even private programs must be certified and employ certified professionals. °· Does the program accept your insurance? If not, can a payment plan be set up? °· Is the facility clean, organized, and well run? Do they allow you to speak with graduates who can share their treatment experience with you? Can you tour the facility? Can you meet with staff? °· Does the program meet the full range of individual needs? °· Does the treatment program address sexual orientation and physical disabilities?  Do they provide age, gender, and culturally appropriate treatment services? °· Is treatment available in languages other than English? °· Is long-term aftercare support or guidance encouraged and provided? °· Is assessment of an individual's treatment plan ongoing to ensure it meets changing needs? °· Does the program use strategies to encourage reluctant patients to remain in treatment long enough to increase the likelihood of success? °· Does the program offer counseling (individual or group) and other behavioral therapies? °· Does the program offer medicine as part of the treatment regimen, if needed? °· Is there ongoing monitoring of possible relapse? Is there a defined relapse prevention program? Are services or referrals offered to family members to ensure they understand addiction and the recovery process? This would help them support the recovering individual. °· Are 12-step meetings held at the center or is transport available for patients to attend outside meetings? °In countries outside of the [place], see local directories for contact information for services in your area. °Document Released: 04/11/2005 Document Revised: 08/05/2011 Document Reviewed: 10/22/2007 °ExitCare® Patient Information ©2014 [employer]. ° ° °Emergency Department Resource Guide °1) Find a Doctor and Pay Out of Pocket °Although you won't have to find out who is covered by your insurance plan, it is a good idea to ask around and get recommendations. You will then need to call the office and see if the doctor you have chosen will accept you as a new patient and what types of options they offer for patients who are self-pay. Some doctors offer discounts or will set up payment plans for their patients who do not have insurance, but you will need to ask so you aren't surprised when you get to your appointment. ° °  2) Contact Your Local Health Department °Not all health departments have doctors that can see patients for sick  visits, but many do, so it is worth a call to see if yours does. If you don't know where your local health department is, you can check in your phone book. The CDC also has a tool to help you locate your state's health department, and many state websites also have listings of all of their local health departments. ° °3) Find a Walk-in Clinic °If your illness is not likely to be very severe or complicated, you may want to try a walk in clinic. These are popping up all over the country in pharmacies, drugstores, and shopping centers. They're usually staffed by nurse practitioners or physician assistants that have been trained to treat common illnesses and complaints. They're usually fairly quick and inexpensive. However, if you have serious medical issues or chronic medical problems, these are probably not your best option. ° °No Primary Care Doctor: °- Call [facility] at  832-8000 - they can help you locate a primary care doctor that  accepts your insurance, provides certain services, etc. °- Physician Referral Service- 1-800-533-3463 ° °Chronic Pain Problems: °Organization         Address  Phone   Notes  °[facility]  (336) 297-2271 Patients need to be referred by their primary care doctor.  ° °Medication Assistance: °Organization         Address  Phone   Notes  °[place] [facility] [address] °[place], [place] [address] (336) 641-8030 --Must be a resident of [place] °-- Must have NO insurance coverage whatsoever (no Medicaid/ Medicare, etc.) °-- The pt. MUST have a primary care doctor that directs their care regularly and follows them in the community °  °[facility]  (866) 331-1348   °[employer]  (888) 892-1162   ° °Agencies that provide inexpensive medical care: °Organization         Address  Phone   Notes  °[facility]  (336) 832-8035   ° Internal Medicine    (336) 832-7272   °[facility] [address] °[place], [place] [address] (336) 832-4777   °[facility] [address], °[place] (336) 271-4999   °[facility]    (336) 373-0678   °[facility]    (336) 272-1050   °[facility] ° [address], [place] Phone:  (336) 832-4444, Fax:  (336) 832-4440 Hours of Operation:  9 am - 6 pm, M-F.  Also accepts Medicaid/Medicare and self-pay.  °[facility] ° [address], [place] Phone: (336) 832-3150, Fax: (336) 832-3151. Hours of Operation:  8:30 am - 5:30 pm, M-F.  Also accepts Medicaid and self-pay.  °[facility] [place] [address], [place] Phone: (336) 878-6027   °[facility] [address], [place], [place] (336)723-1848, Ext. 123 Mondays & Thursdays: 7-9 AM.  First 15 patients are seen on a first come, first serve basis. °  ° °Medicaid-accepting [place] Providers: ° °Organization         Address  Phone   Notes  °[facility] [address], [place] (336) 641-2100 Also accepts self-pay patients.  °[facility] [address], [place] ° (336) 856-9996   °[facility] [address], [place] (336) 288-8857   °[facility] [address] [place] [address], [place] (336) 299-7000   °Veita Bland [address], [place]  ° (336) 373-1557 Only accepts [place] Access Medicaid patients after they have their name applied to their card.  ° °Self-Pay (no insurance) in [place]: ° °Organization         Address  Phone   Notes  °Sickle Cell Patients, [facility] [address], [place] (336) 832-1970   °[facility] [address], [place] (336) 832-4400   °[facility] ° [address], [place] (336) 992-4800   °[facility]/Dr. Osei-Bonsu ° [address] [place] [address],  [place] or [address], [place] (336) 841-8500 Phone number for both [place] and [place] locations is the same.  °[facility] [address], [place] (336) 299-0000   °[facility] [address] [place] [address], [place] or [address] (336) 852-7530 °(336) 878-2260   °[facility] [address], [place] (336) 350-1642, phone; (336) 294-5005, fax Sees patients 1st and 3rd Saturday of every month.  Must not qualify for public or private insurance (i.e. Medicaid, Medicare, [place] Health Choice, Veterans' Benefits) • Household income should be no more than 200% of the poverty level •The clinic cannot treat you if you are pregnant or think you are pregnant • Sexually transmitted diseases are not treated at the clinic.  ° ° °Dental Care: °Organization         Address  Phone  Notes  °[facility] [address], [place] (336) 641-6152 Accepts children up to age 21 who are enrolled in Medicaid or [place] Health Choice; pregnant women with a Medicaid card; and children who have applied for Medicaid or [place] Health Choice, but were declined, whose parents can pay a reduced fee at time of service.  °[facility] [place]  [address], [place] (336) 641-7733 Accepts children up to age 21 who are enrolled in Medicaid or [place] Health Choice; pregnant women with a Medicaid card; and children who have applied for Medicaid or  Health Choice, but were declined, whose parents can pay a reduced fee at time of service.  °[facility] ° [address], [place] (336) 641-4533 Patients are seen by appointment only. Walk-ins are not accepted. [facility] will see patients 18 years of age and older. °Monday - Tuesday (8am-5pm) °Most Wednesdays (8:30-5pm) °$30 per visit, cash only  °[facility] ° [address], [place] (336) 641-4533 Patients are seen by appointment only. Walk-ins are not accepted. [facility] will see patients 18 years of age and older. °One Wednesday Evening (Monthly: Volunteer Based).  $30 per visit, cash only  °[facility]  (919) 537-3737 for adults; Children under age 4, call Graduate Pediatric Dentistry at ([phone]) [phone]. Children aged 4-14, please call ([phone]) [phone] to request a pediatric application. ° Dental services are provided in all areas of dental care including fillings, crowns and bridges, complete and partial dentures, implants, gum treatment, root canals, and extractions. Preventive care is also provided. Treatment is provided to both adults and children. °Patients are selected via a lottery and there is often a waiting list. °  °[facility] [address], °  [place] ° (336) 763-8833 [email or web address] °  °[facility] [address], [place], [place] (336)723-1848, Ext. 123 Second and Fourth Thursday of each month, opens at 6:30 AM; Clinic ends at 9 AM.  Patients are seen on a first-come first-served basis, and a limited number are seen during each clinic.  ° °[facility] ° [address], [place], [place] (336) 723-7904   Eligibility Requirements °You must have lived in [place], [place], or [place] counties for at least the last three months. °  You cannot be eligible for state or federal sponsored healthcare insurance, including Veterans Administration, Medicaid, or Medicare. °  You generally cannot be eligible for healthcare insurance through your employer.  °  How to apply: °Eligibility screenings are held every Tuesday and Wednesday afternoon from 1:00 pm until 4:00 pm. You do not need an appointment for the interview!  °[facility] [address], [place], [place] 336-631-2330   °[facility]  336-342-8273   °[facility]  336-703-3100   °[facility]  336-570-6415   ° °Behavioral Health Resources in the Community: °Intensive Outpatient Programs °Organization         Address  Phone  Notes  °[facility] [address], [place], [place] 336-878-6098   °[facility] [address], [place], [place] 336-832-9800   °[facility] [address], [place], [place] ° 336-882-2125   °[facility] [address],  °[place], [place] 1-800-853-5163 or 336-641-4981   °Substance Abuse Resources °Organization         Address  Phone  Notes  °[facility]  336-882-2125   °[facility]  336-784-9470   °[facility]  336-285-9073   °[facility]  336-845-3988   °Residential & Outpatient Substance Abuse Program  1-800-659-3381   °Psychological Services °Organization         Address  Phone  Notes  ° Health  336- 832-9600   °[facility]  336- 378-7881   °[facility] [address], [place] 1-800-853-5163 or 336-641-4981   ° °Mobile Crisis Teams °Organization         Address  Phone  Notes  °[facility], Mobile Crisis Care Unit  1-877-626-1772   °Assertive °[facility] ° [address] [place], [place] 336-834-9664   °Sharon DeEsch [address] °[place] 336-554-5454   ° °Self-Help/Support Groups °Organization         Address  Phone             Notes  °[facility] - variety of support groups  336- 373-1402 Call for more information  °Narcotics Anonymous (NA), [facility] [address], °[place]  2 meetings at this location  ° °Residential Treatment Programs °Organization         Address  Phone  Notes  °[facility] [address],    °[place]  1-866-801-8205   °[facility] ° [address], [place], [place] 704-293-8524   °[facility] [address], [place]  336-845-3988 Admissions: 8am-3pm M-F  °[facility] [address]    °[place], [place] 336-841-1104   °[facility] [address], [place],  336-379-7146   °[facility] [address]  °[place],  336-285-9073   °  [facility] - Intensive Outpatient [address], [place], [place] 336-852-3033   °[facility] [address]  °[place], [place] 1-877-615-2722 or 336-784-9470   °[facility] [address] [place], [place] 336-227-7417 Accepts Medicaid  °[facility] [address]  °[place] 1-800-659-3381 Substance Abuse/Addiction Treatment  ° °[place] Behavioral Health Resources °Organization         Address  Phone  Notes  °[facility]  (888) 581-9988   °Julie Brannon, PhD [address] [place], [place]   (336) 349-5553 or (336) 951-0000   °[facility]   [address] °[place], [place] (336) 349-4454   °[facility] [address], [place], [place] (336) 342-8316 Insurance/Medicaid/sponsorship through [facility]  °[facility] [address]                                    [place], [place] (336) 342-8316 Therapy/tele-psych/case  °[facility] [address]  ° [place], [place] (336) 349-2233    °Dr. Arfeen  (336) 349-4544   °[facility] [place]  [employer] [place] [facility] 1) [address], [place] °2) [address], [place] °3)  [address], [place] (336) 349-3220 °(336) 342-7768 ° °(336) 342-8140   °[place] [facility] (336) 342-1394 or (336) 342-3537 (After Hours)    ° ° ° °

## 2013-08-23 NOTE — ED Notes (Signed)
Patient is very anxious-refused labs at this time-will attempt at later time

## 2013-08-23 NOTE — ED Notes (Addendum)
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 Pt attempted to be admitted to [facility], but was denied. Reports he also has been to [facility] and [facility]. Pt states that he called the helpline this morning at 0100 d/t feeling of cold sweats, diarrhea, nausea, insomnia, restlessness and generalized body aches. Denies SI/HI, but states "I feel like I am losing control with my anxiety." Denies AVH. Pt calm and cooperative in triage, a&o x4, noted to be anxious and agitated.

## 2016-04-15 ENCOUNTER — Ambulatory Visit (HOSPITAL_COMMUNITY)
Admission: EM | Admit: 2016-04-15 | Discharge: 2016-04-15 | Disposition: A | Discharge status: Home / Self Care | Payer: Self-pay | Attending: Emergency Medicine | Admitting: Emergency Medicine

## 2016-04-15 ENCOUNTER — Encounter (HOSPITAL_COMMUNITY): Payer: Self-pay | Admitting: Emergency Medicine

## 2016-04-15 ENCOUNTER — Telehealth (HOSPITAL_COMMUNITY): Payer: Self-pay | Admitting: *Deleted

## 2016-04-15 DIAGNOSIS — R197 Diarrhea, unspecified: Secondary | ICD-10-CM

## 2016-04-15 DIAGNOSIS — I159 Secondary hypertension, unspecified: Secondary | ICD-10-CM

## 2016-04-15 HISTORY — DX: Essential (primary) hypertension: I10

## 2016-04-15 MED ORDER — CLONAZEPAM 1 MG PO TABS: 1.0000 mg | ORAL_TABLET | Freq: Two times a day (BID) | ORAL | 0 refills | Status: AC | PRN

## 2016-04-15 MED ORDER — ONDANSETRON HCL 4 MG PO TABS: 4.0000 mg | ORAL_TABLET | Freq: Three times a day (TID) | ORAL | 0 refills | Status: AC | PRN

## 2016-04-15 MED ORDER — CLONIDINE HCL 0.1 MG PO TABS
0.1000 mg | ORAL_TABLET | Freq: Once | ORAL | Status: AC
  Administered 2016-04-15: 0.1 mg via ORAL

## 2016-04-15 MED ORDER — CLONAZEPAM 0.5 MG PO TABS: 0.2500 mg | ORAL_TABLET | Freq: Once | ORAL | Status: DC

## 2016-04-15 MED ORDER — CLONIDINE HCL 0.1 MG PO TABS: 0.1000 mg | ORAL_TABLET | Freq: Two times a day (BID) | ORAL | 0 refills | Status: DC

## 2016-04-15 MED ORDER — CLONIDINE HCL 0.1 MG PO TABS
ORAL_TABLET | ORAL | Status: AC
  Filled 2016-04-15: qty 1

## 2016-04-15 NOTE — ED Triage Notes (Addendum)
Felt bad for three days.  Saturday felt generally bad, non-specific.  Yesterday same as Saturday and having abdominal pain.  Today added nausea and diarrhea started.  Patient reports 5 episodes of diarrhea today.  Lower abdomen is hurting.    Stopped taking klonopin 5 days ago.  Stopped abruptly because pcp is no longer writing for this drug and has referred to psych for further refills.  Patient has been on 4mg  for 20 years and abruptly stopped.  Patient no longer has a pcp

## 2016-04-15 NOTE — Discharge Instructions (Signed)
Push electrolyte containing fluids such as Pedialyte and Gatorade. The Zofran will help with the nausea and will slow down the diarrhea. Go to the ER if your belly pain changes, gets worse, he started having blood in your diarrhea.  Keep a log of your blood pressure. Go to the ER if your blood pressure is measuring 200/100 despite being on the medications or for chest pain, headache, problems seeing, problems talking, problems walking, if you feel like you're about to pass out, if you do pass out, if you have a seizure, or for any other concerns.  I'm giving you a Klonopin taper so that you can get off this medicine gradually. We'll need to talk to your primary care physician at late tonight or with a psychiatrist for further management. Go to the ER for any signs of benzodiazepine withdrawal as we discussed.

## 2016-04-15 NOTE — ED Provider Notes (Signed)
HPI  SUBJECTIVE:  Mark Frye is a 40 y.o. male who presents with lethargy, fatigue, decreased appetite, "feeling bad" for the past several days. Reports increasing anxiety and panic attacks. States that he ran out of his Klonopin that he has been on for 20 years about 4 days ago. States he takes 1 mg every 6 hours. He reports low midline nonradiating nonmigratory intermittent seconds long crampy abdominal pain followed by watery, nonbloody, diarrhea. This started today. States he has had approximately 5 episodes of diarrhea today. He denies vomiting, fevers, chest pain, shortness of breath, syncope. His abdominal pain is worse before having a bowel movement, better after having the diarrhea. No abdominal distention, urinary complaints, change in urine output. No change in his diet, questionable leftovers, raw or undercooked foods, known sick contacts. He states that he was told by his PMD back in August that he would not be able to write any more Klonopin, was given psychiatric referrals, patient states that he has not been able to get in the psychiatrist. states that the earliest appointment available is January 2018. Patient also states that he ran out of his blood pressure medicines 2 days ago here.  states that he takes clonidine 0.1 mg twice a day and states that this controls his blood pressure well. He brings in documentation showing that his previous pressure was 174/84 back in July. He has a past medical history of anxiety, prescription opiate abuse, on methadone. He has no past medical history of diabetes, states does not drink or use illicit drugs. No history of heart attack hypercholesterolemia. No history of benzodiazepine withdrawal. [facility]    Past Medical History:  Diagnosis Date  . Asthma   . Hypertension   . Substance abuse     History reviewed. No pertinent surgical history.  No family history on file.  Social History  Substance  Use Topics  . Smoking status: Current Every Day Smoker    Packs/day: 0.50    Types: Cigarettes  . Smokeless tobacco: Never Used  . Alcohol use No    No current facility-administered medications for this encounter.   Current Outpatient Prescriptions:  .  clonazePAM (KLONOPIN) 1 MG tablet, Take 1 tablet (1 mg total) by mouth 2 (two) times daily as needed. Take one pill 3 times a day today, one pill twice a day on days 2 and 3, 1/2 pill twice a day on days #4 and 5, Disp: 9 tablet, Rfl: 0 .  cloNIDine (CATAPRES) 0.1 MG tablet, Take 1 tablet (0.1 mg total) by mouth 2 (two) times daily., Disp: 60 tablet, Rfl: 0 .  ondansetron (ZOFRAN) 4 MG tablet, Take 1 tablet (4 mg total) by mouth every 8 (eight) hours as needed for nausea. May cause constipation, Disp: 20 tablet, Rfl: 0  No Known Allergies   ROS  As noted in HPI.   Physical Exam  BP 142/80 (BP Location: Right Arm) Comment (BP Location): large cuff  Pulse 105   Temp 98.2 F (36.8 C) (Oral)   SpO2 99%   BP Readings from Last 3 Encounters:  04/15/16 142/80  08/23/13 147/90  11/01/12 126/83   Orthostatic VS for the past 24 hrs:  BP- Lying Pulse- Lying BP- Sitting Pulse- Sitting BP- Standing at 0 minutes Pulse- Standing at 0 minutes  04/15/16 1245 (!) 228/152 97 (!) 153/91 101 (!) 190/111 111      Constitutional: Well developed, well nourished, no acute distress. Appears anxious.  Eyes:  EOMI, conjunctiva normal bilaterally HENT: Normocephalic, atraumatic,mucus membranes moist Respiratory: Normal inspiratory effort lungs clear bilaterally Cardiovascular: Normal rate , mild tachycardia, regular. No murmurs rubs or gallops Refill less than 2 seconds. GI: nondistended soft, nontender. Active bowel sounds. skin: No rash, skin intact Musculoskeletal: no deformities Neurologic: Alert & oriented x 3, no focal neuro deficits. No tremor. Psychiatric: Speech and behavior appropriate   ED Course   Medications  cloNIDine  (CATAPRES) tablet 0.1 mg (0.1 mg Oral Given 04/15/16 1346)    Orders Placed This Encounter  Procedures  . Orthostatic vital signs    Standing Status:   Standing    Number of Occurrences:   1  . Re-check Vital Signs    Standing Status:   Standing    Number of Occurrences:   1    No results found for this or any previous visit (from the past 24 hour(s)). No results found.  ED Clinical Impression  Diarrhea, unspecified type  Secondary hypertension   ED Assessment/Plan  [facility] narcotic database reviewed. Patient has been getting his Klonopin prescription from 1 provider for the past 6 months. Last filled Klonopin 1 mg on 10/31, #120.   Patient is slightly orthostatic, but his no change in his urine output. Does not appear significantly dehydrated. This could be from decreased by mouth intake from the past several days. Do not think that he is having active benzodiazepine withdrawal in the absence of tremors.   Blood pressure noted. Last blood pressure measurement at PMD's office in August was 174/84. Patient denies visual changes, headache, chest pain, shortness of breath, pain tearing through to his back, dysarthria, facial droop, arm or leg weakness, discoordination, anuria, hematuria or lower extremity edema. He ran out of his clonidine 2 days ago, feel that since his blood pressure is most likely rebound hypertension. Giving 0.1 mg of clonidine here and we'll repeat blood pressure. He is refusing to go to the ED.  Repeat manual BP 142/80 HR 105. Patient states he feels much better.  Pt admits that he has been taking more of his Klonopin than usual. He is requesting a refill of his clonidine. We'll give him a month's worth of clonidine 0.1 mg twice a day. He is to continue monitoring his blood pressure at home.   Will also give him a 3 day benzodiazepine taper to help prevent BNZ withdrawal. Klonopin 1 mg 3 times a day today, twice a day days 2-3, 1/2 mg twice a day for days  #4-5, dispense 9. We'll have him push fluids, some Zofran for the diarrhea. He is to return to his primary care physician for continued medication refills, and discuss with them what can be done regarding the benzodiazepine use. Advised patient to try the calling [facility] on [address].  Discussed  MDM, plan and followup with patient. Discussed sn/sx that should prompt return to the ED. Patient agrees with plan.   Meds ordered this encounter  Medications  . DISCONTD: clonazePAM (KLONOPIN) tablet 0.25 mg  . cloNIDine (CATAPRES) tablet 0.1 mg  . cloNIDine (CATAPRES) 0.1 MG tablet    Sig: Take 1 tablet (0.1 mg total) by mouth 2 (two) times daily.    Dispense:  60 tablet    Refill:  0  . clonazePAM (KLONOPIN) 1 MG tablet    Sig: Take 1 tablet (1 mg total) by mouth 2 (two) times daily as needed. Take one pill 3 times a day today, one pill twice a  day on days 2 and 3, 1/2 pill twice a day on days #4 and 5    Dispense:  9 tablet    Refill:  0  . ondansetron (ZOFRAN) 4 MG tablet    Sig: Take 1 tablet (4 mg total) by mouth every 8 (eight) hours as needed for nausea. May cause constipation    Dispense:  20 tablet    Refill:  0    *This clinic note was created using Scientist, clinical (histocompatibility and immunogenetics)Dragon dictation software. Therefore, there may be occasional mistakes despite careful proofreading.  ?   Domenick GongAshley Cande Mastropietro, MD 04/15/16 [phone]

## 2017-10-01 ENCOUNTER — Encounter (HOSPITAL_COMMUNITY): Payer: Self-pay | Admitting: Family Medicine

## 2017-10-01 ENCOUNTER — Ambulatory Visit (HOSPITAL_COMMUNITY)
Admission: EM | Admit: 2017-10-01 | Discharge: 2017-10-01 | Disposition: A | Discharge status: Home / Self Care | Payer: Self-pay | Attending: Family Medicine | Admitting: Family Medicine

## 2017-10-01 DIAGNOSIS — I10 Essential (primary) hypertension: Secondary | ICD-10-CM

## 2017-10-01 MED ORDER — CLONIDINE HCL 0.1 MG PO TABS: 0.1000 mg | ORAL_TABLET | Freq: Two times a day (BID) | ORAL | 3 refills | Status: AC

## 2017-10-01 NOTE — ED Triage Notes (Signed)
Pt reports that he hasn't had BP meds in a couple of months. Sts today his blood pressure was high at his therapist.

## 2017-10-08 NOTE — ED Provider Notes (Signed)
[facility]   [id number] 10/01/17 Arrival Time: 1907  ASSESSMENT & PLAN:  1. Essential hypertension     Meds ordered this encounter  Medications  . cloNIDine (CATAPRES) 0.1 MG tablet    Sig: Take 1 tablet (0.1 mg total) by mouth 2 (two) times daily.    Dispense:  60 tablet    Refill:  3   Looking for a PCP. May f/u here in 1-2 weeks for BP check.  Reviewed expectations re: course of current medical issues. Questions answered. Outlined signs and symptoms indicating need for more acute intervention. Patient verbalized understanding. After Visit Summary given.   SUBJECTIVE:  Mark Frye is a 42 y.o. male who presents with concerns regarding increased blood pressures. He reports that he has been treated for hypertension in the past. Out of blood pressure meds for a couple of months. "I feel ok." Was at his therapist today and they noted increased blood pressures. Told him that he needed to come here for evaluation.  Denies symptoms of chest pain, palpations, orthopnea, nocturnal dyspnea, or LE edema.  Social History   Tobacco Use  Smoking Status Current Every Day Smoker  . Packs/day: 0.50  . Types: Cigarettes  Smokeless Tobacco Never Used    ROS: As per HPI.   OBJECTIVE:  Vitals:   10/01/17 1939  BP: (!) 166/84  Pulse: 94  Resp: 18  Temp: 98.4 F (36.9 C)  SpO2: 100%    General appearance: alert; no distress Eyes: PERRLA; EOMI HENT: normocephalic; atraumatic Neck: supple Lungs: clear to auscultation bilaterally Heart: regular rate and rhythm without murmer Abdomen: soft, non-tender; obese Extremities: no cyanosis or edema; symmetrical with no gross deformities Skin: warm and dry Psychological: alert and cooperative; normal mood and affect  No Known Allergies  Past Medical History:  Diagnosis Date  . Asthma   . Hypertension   . Substance abuse (HCC)    Social History   Socioeconomic History  . Marital status: Single    Spouse name:  Not on file  . Number of children: Not on file  . Years of education: Not on file  . Highest education level: Not on file  Occupational History  . Not on file  Social Needs  . Financial resource strain: Not on file  . Food insecurity:    Worry: Not on file    Inability: Not on file  . Transportation needs:    Medical: Not on file    Non-medical: Not on file  Tobacco Use  . Smoking status: Current Every Day Smoker    Packs/day: 0.50    Types: Cigarettes  . Smokeless tobacco: Never Used  Substance and Sexual Activity  . Alcohol use: No  . 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 Sexual activity: Not on file  Lifestyle  . Physical activity:    Days per week: Not on file    Minutes per session: Not on file  . Stress: Not on file  Relationships  . Social connections:    Talks on phone: Not on file    Gets together: Not on file    Attends religious service: Not on file    Active member of club or organization: Not on file    Attends meetings of clubs or organizations: Not on file    Relationship status: Not on file  . Intimate partner violence:    Fear of current or ex partner: Not on file  Emotionally abused: Not on file    Physically abused: Not on file    Forced sexual activity: Not on file  Other Topics Concern  . Not on file  Social History Narrative  . Not on file    History reviewed. No pertinent surgical history.     Mardella Layman, MD 10/09/17 [phone]

## 2022-10-11 ENCOUNTER — Other Ambulatory Visit (HOSPITAL_COMMUNITY): Payer: Self-pay

## 2022-10-11 MED ORDER — AMPHETAMINE-DEXTROAMPHETAMINE 30 MG PO TABS
1.0000 | ORAL_TABLET | Freq: Two times a day (BID) | ORAL | 0 refills | Status: AC | PRN
  Filled 2022-10-11: qty 60, 30d supply, fill #0

## 2023-01-30 ENCOUNTER — Other Ambulatory Visit (HOSPITAL_COMMUNITY): Payer: Self-pay

## 2023-01-30 MED ORDER — AMPHETAMINE-DEXTROAMPHETAMINE 30 MG PO TABS
30.0000 mg | ORAL_TABLET | Freq: Two times a day (BID) | ORAL | 0 refills | Status: AC | PRN
  Filled 2023-01-30: qty 60, 30d supply, fill #0

## 2023-03-27 ENCOUNTER — Other Ambulatory Visit (HOSPITAL_COMMUNITY): Payer: Self-pay

## 2023-03-28 ENCOUNTER — Other Ambulatory Visit (HOSPITAL_COMMUNITY): Payer: Self-pay

## 2023-03-28 MED ORDER — AMPHETAMINE-DEXTROAMPHETAMINE 30 MG PO TABS
30.0000 mg | ORAL_TABLET | Freq: Two times a day (BID) | ORAL | 0 refills | Status: DC | PRN
  Filled 2023-03-28: qty 60, 30d supply, fill #0

## 2023-05-22 ENCOUNTER — Other Ambulatory Visit (HOSPITAL_COMMUNITY): Payer: Self-pay

## 2023-05-22 MED ORDER — AMPHETAMINE-DEXTROAMPHETAMINE 30 MG PO TABS
30.0000 mg | ORAL_TABLET | Freq: Two times a day (BID) | ORAL | 0 refills | Status: DC | PRN
  Filled 2023-05-22 – 2023-05-26 (×3): qty 60, 30d supply, fill #0

## 2023-05-26 ENCOUNTER — Other Ambulatory Visit: Payer: Self-pay

## 2023-05-26 ENCOUNTER — Other Ambulatory Visit (HOSPITAL_COMMUNITY): Payer: Self-pay

## 2023-06-30 ENCOUNTER — Other Ambulatory Visit (HOSPITAL_COMMUNITY): Payer: Self-pay

## 2023-06-30 MED ORDER — AMPHETAMINE-DEXTROAMPHETAMINE 30 MG PO TABS
30.0000 mg | ORAL_TABLET | Freq: Two times a day (BID) | ORAL | 0 refills | Status: DC | PRN
  Filled 2023-06-30: qty 60, 30d supply, fill #0

## 2023-06-30 MED ORDER — CLONAZEPAM 1 MG PO TABS
1.0000 mg | ORAL_TABLET | Freq: Three times a day (TID) | ORAL | 0 refills | Status: AC | PRN
  Filled 2023-06-30: qty 90, 30d supply, fill #0

## 2023-06-30 MED ORDER — LISINOPRIL-HYDROCHLOROTHIAZIDE 20-12.5 MG PO TABS
1.0000 | ORAL_TABLET | Freq: Every day | ORAL | 3 refills | Status: AC
  Filled 2023-06-30: qty 90, 90d supply, fill #0

## 2023-07-26 ENCOUNTER — Other Ambulatory Visit (HOSPITAL_COMMUNITY): Payer: Self-pay

## 2023-07-28 ENCOUNTER — Other Ambulatory Visit (HOSPITAL_COMMUNITY): Payer: Self-pay

## 2023-07-28 ENCOUNTER — Encounter (HOSPITAL_COMMUNITY): Payer: Self-pay

## 2023-07-28 MED ORDER — CLONAZEPAM 1 MG PO TABS
1.0000 mg | ORAL_TABLET | Freq: Three times a day (TID) | ORAL | 1 refills | Status: AC | PRN
  Filled 2023-07-28: qty 90, 30d supply, fill #0
  Filled 2023-08-21 – 2023-08-25 (×5): qty 90, 30d supply, fill #1

## 2023-07-28 MED ORDER — AMPHETAMINE-DEXTROAMPHETAMINE 30 MG PO TABS
30.0000 mg | ORAL_TABLET | Freq: Two times a day (BID) | ORAL | 0 refills | Status: AC | PRN
  Filled 2023-07-28: qty 60, 30d supply, fill #0

## 2023-08-21 ENCOUNTER — Encounter (HOSPITAL_COMMUNITY): Payer: Self-pay

## 2023-08-21 ENCOUNTER — Other Ambulatory Visit (HOSPITAL_COMMUNITY): Payer: Self-pay

## 2023-08-23 ENCOUNTER — Encounter (HOSPITAL_COMMUNITY): Payer: Self-pay

## 2023-08-23 ENCOUNTER — Other Ambulatory Visit (HOSPITAL_COMMUNITY): Payer: Self-pay

## 2023-08-25 ENCOUNTER — Other Ambulatory Visit (HOSPITAL_COMMUNITY): Payer: Self-pay

## 2023-08-25 ENCOUNTER — Other Ambulatory Visit: Payer: Self-pay
